# Patient Record
Sex: Female | Born: 2000 | Race: White | Hispanic: No | Marital: Single | Smoking: Never smoker
Health system: Southern US, Community
[De-identification: ages and names within clinical notes are randomized; demographics above are authoritative.]

---

## 2021-03-09 ENCOUNTER — Ambulatory Visit (HOSPITAL_COMMUNITY): Payer: Managed Care, Other (non HMO)

## 2021-03-09 ENCOUNTER — Other Ambulatory Visit: Payer: Self-pay

## 2021-03-09 ENCOUNTER — Encounter (HOSPITAL_COMMUNITY): Payer: Self-pay | Admitting: *Deleted

## 2021-03-09 ENCOUNTER — Ambulatory Visit (INDEPENDENT_AMBULATORY_CARE_PROVIDER_SITE_OTHER): Payer: Managed Care, Other (non HMO)

## 2021-03-09 ENCOUNTER — Ambulatory Visit (HOSPITAL_COMMUNITY)
Admission: EM | Admit: 2021-03-09 | Discharge: 2021-03-09 | Disposition: A | Discharge status: Home / Self Care | Payer: Managed Care, Other (non HMO) | Attending: Student | Admitting: Student

## 2021-03-09 DIAGNOSIS — S60221A Contusion of right hand, initial encounter: Secondary | ICD-10-CM | POA: Diagnosis not present

## 2021-03-09 DIAGNOSIS — S6721XA Crushing injury of right hand, initial encounter: Secondary | ICD-10-CM | POA: Diagnosis not present

## 2021-03-09 NOTE — ED Triage Notes (Signed)
Pt closed rt middle and ring finger in bed room window.

## 2021-03-09 NOTE — ED Provider Notes (Signed)
[facility]    CSN: [id number] Arrival date & time: 03/09/21  1756      History   Chief Complaint Chief Complaint  Patient presents with   Finger Injury    HPI Tanya Mack is a 20 y.o. female presenting with contusion of right middle 3 fingers following shutting her right hand in bedroom window.  States incident occurred few hours ago.  Pain at the base of her fingers and the PIP joints, though she can bend the fingers without issue.  Denies sensation changes.  She is right-handed.  Denies pain or injury elsewhere.  HPI  History reviewed. No pertinent past medical history.  There are no problems to display for this patient.   History reviewed. No pertinent surgical history.  OB History   No obstetric history on file.      Home Medications    Prior to Admission medications   Not on File    Family History History reviewed. No pertinent family history.  Social History Social History   Tobacco Use   Smoking status: Never   Smokeless tobacco: Never     Allergies   Patient has no known allergies.   Review of Systems Review of Systems  Musculoskeletal:        R hand contusion  All other systems reviewed and are negative.   Physical Exam Triage Vital Signs ED Triage Vitals  Enc Vitals Group     BP 03/09/21 1814 122/66     Pulse Rate 03/09/21 1814 70     Resp 03/09/21 1814 18     Temp 03/09/21 1814 98.6 F (37 C)     Temp src --      SpO2 03/09/21 1814 98 %     Weight --      Height --      Head Circumference --      Peak Flow --      Pain Score 03/09/21 1816 6     Pain Loc --      Pain Edu? --      Excl. in GC? --    No data found.  Updated Vital Signs BP 122/66   Pulse 70   Temp 98.6 F (37 C)   Resp 18   LMP 02/23/2021 (Approximate)   SpO2 98%   Visual Acuity Right Eye Distance:   Left Eye Distance:   Bilateral Distance:    Right Eye Near:   Left Eye Near:    Bilateral Near:     Physical Exam Vitals  reviewed.  Constitutional:      General: She is not in acute distress.    Appearance: Normal appearance. She is not ill-appearing or diaphoretic.  HENT:     Head: Normocephalic and atraumatic.  Cardiovascular:     Rate and Rhythm: Normal rate and regular rhythm.     Heart sounds: Normal heart sounds.  Pulmonary:     Effort: Pulmonary effort is normal.     Breath sounds: Normal breath sounds.  Musculoskeletal:     Comments: R fingers 2-4 with tenderness over dorsal aspect of 1st phalanx and PIP joint without effusion ecchymosis or obvious bony deformity. ROM PIP and DIP intact and with minimal pain. Grip strength 5/5, cap refill <2 seconds, radial pulse 2+. No snuffbox tenderness.  Skin:    General: Skin is warm.  Neurological:     General: No focal deficit present.     Mental Status: She is alert and oriented to person, place,  and time.  Psychiatric:        Mood and Affect: Mood normal.        Behavior: Behavior normal.        Thought Content: Thought content normal.        Judgment: Judgment normal.     UC Treatments / Results  Labs (all labs ordered are listed, but only abnormal results are displayed) Labs Reviewed - No data to display  EKG   Radiology DG Hand Complete Right  Result Date: 03/09/2021 CLINICAL DATA:  Shut middle 3 fingers in car door EXAM: RIGHT HAND - COMPLETE 3+ VIEW COMPARISON:  None. FINDINGS: No acute fracture or dislocation. Joint spaces and alignment are maintained. No area of erosion or osseous destruction. No unexpected radiopaque foreign body. Soft tissues are unremarkable. IMPRESSION: No acute fracture or dislocation. Electronically Signed   By: Meda Klinefelter M.D.   On: 03/09/2021 18:18    Procedures Procedures (including critical care time)  Medications Ordered in UC Medications - No data to display  Initial Impression / Assessment and Plan / UC Course  I have reviewed the triage vital signs and the nursing notes.  Pertinent labs &  imaging results that were available during my care of the patient were reviewed by me and considered in my medical decision making (see chart for details).     This patient is a very pleasant 20 y.o. year old female presenting with finger contusion R hand following shutting hand in bedroom window. Xray negative for bony abnormality. Reassurance provided. Patient declines splint which I am in agreement with. ED return precautions discussed. Patient verbalizes understanding and agreement.     Final Clinical Impressions(s) / UC Diagnoses   Final diagnoses:  Contusion of right hand, initial encounter     Discharge Instructions      -Your xrays look good! -Rest, ice, tylenol/ibuprofen -Follow-up if symptoms worsen/persist   ED Prescriptions   None    PDMP not reviewed this encounter.   Rhys Martini, PA-C 03/09/21 [phone]

## 2021-03-09 NOTE — Discharge Instructions (Addendum)
-  Your xrays look good! -Rest, ice, tylenol/ibuprofen -Follow-up if symptoms worsen/persist

## 2022-09-16 IMAGING — DX DG HAND COMPLETE 3+V*R*
3 series · 3 of 3 positions shown · non-contrast
Comparison: None.

CLINICAL DATA: Shut middle 3 fingers in car door

EXAM:
RIGHT HAND - COMPLETE 3+ VIEW

[hand pa]
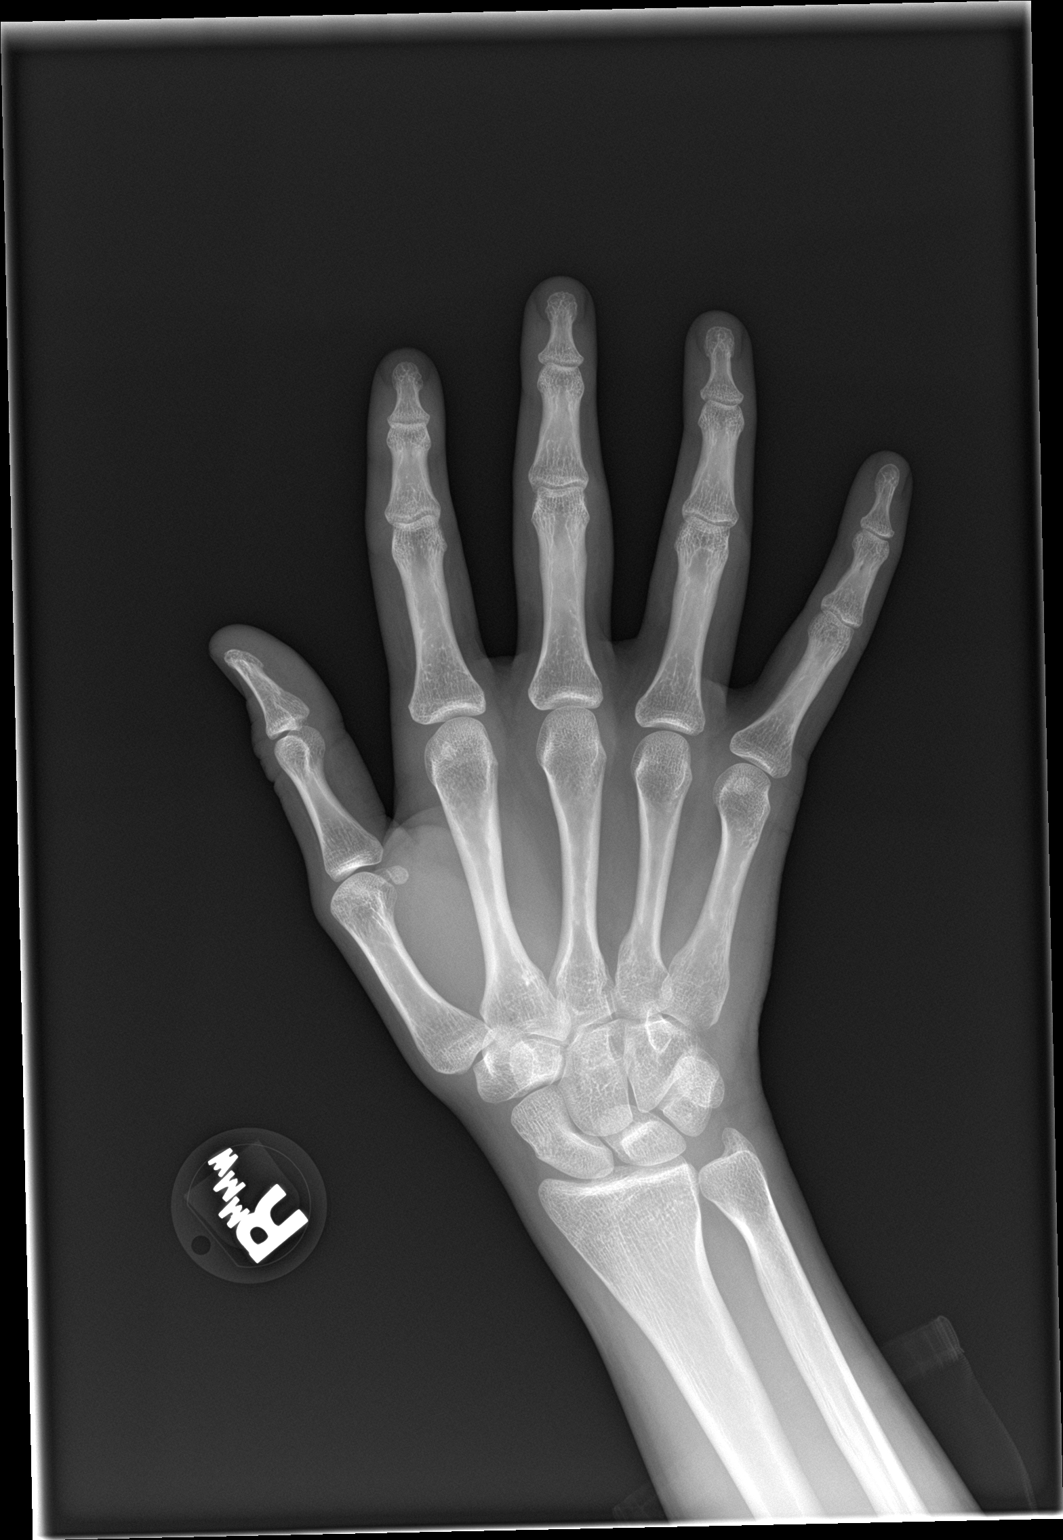

[hand obl]
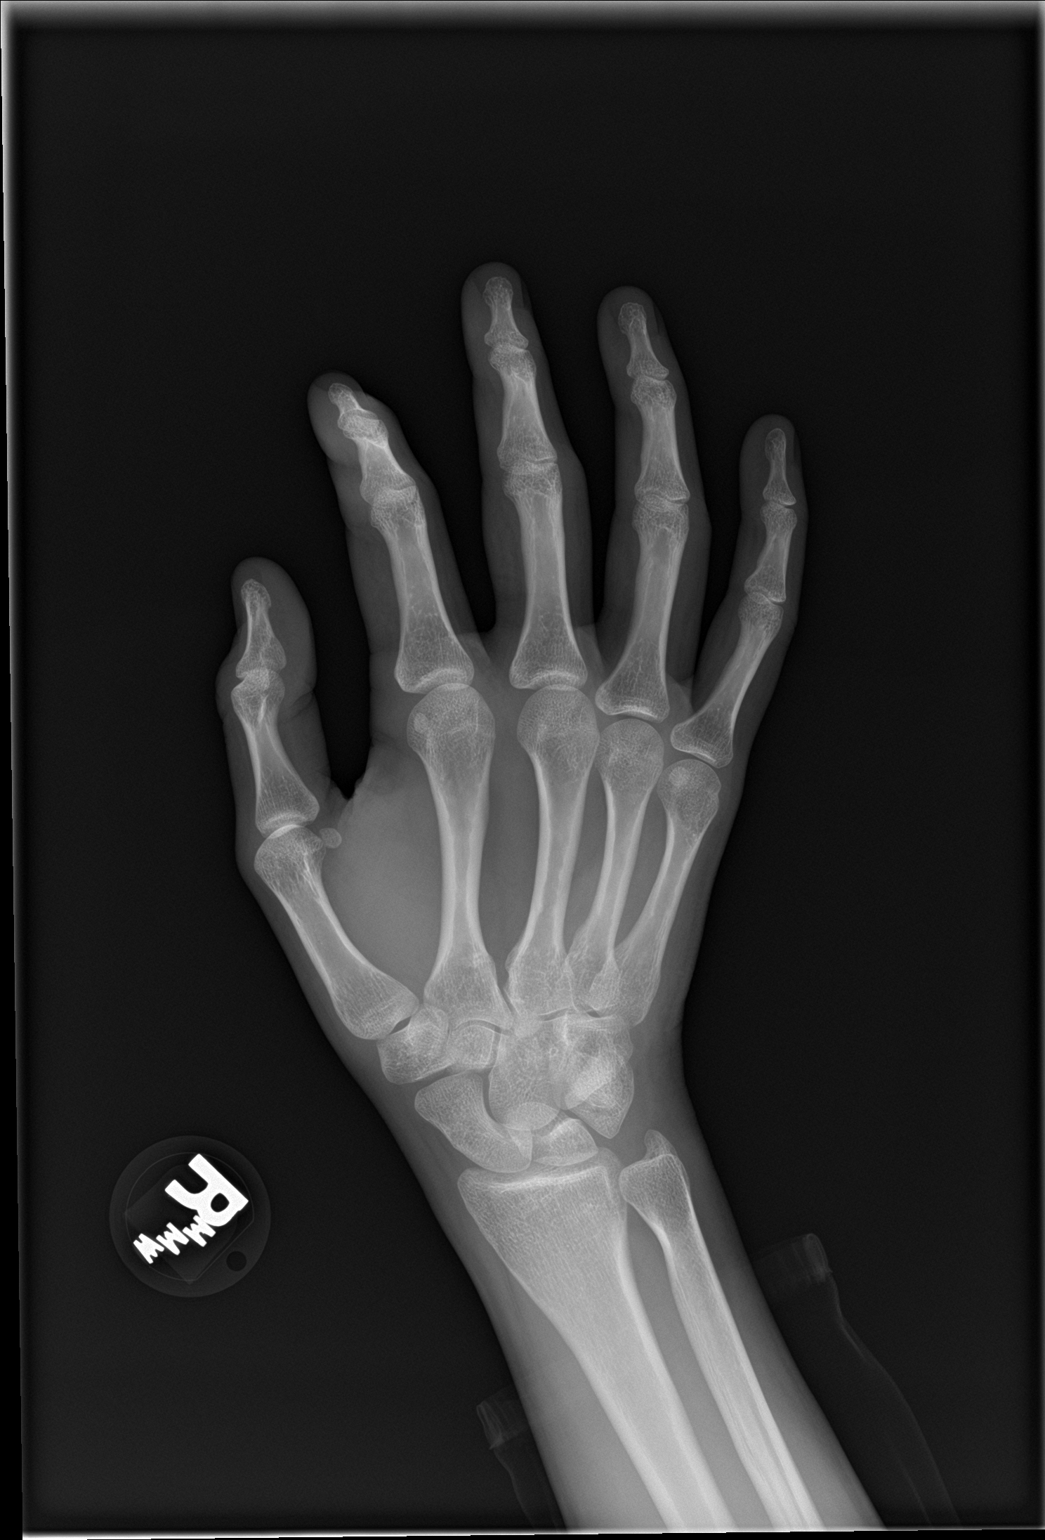

[hand lat]
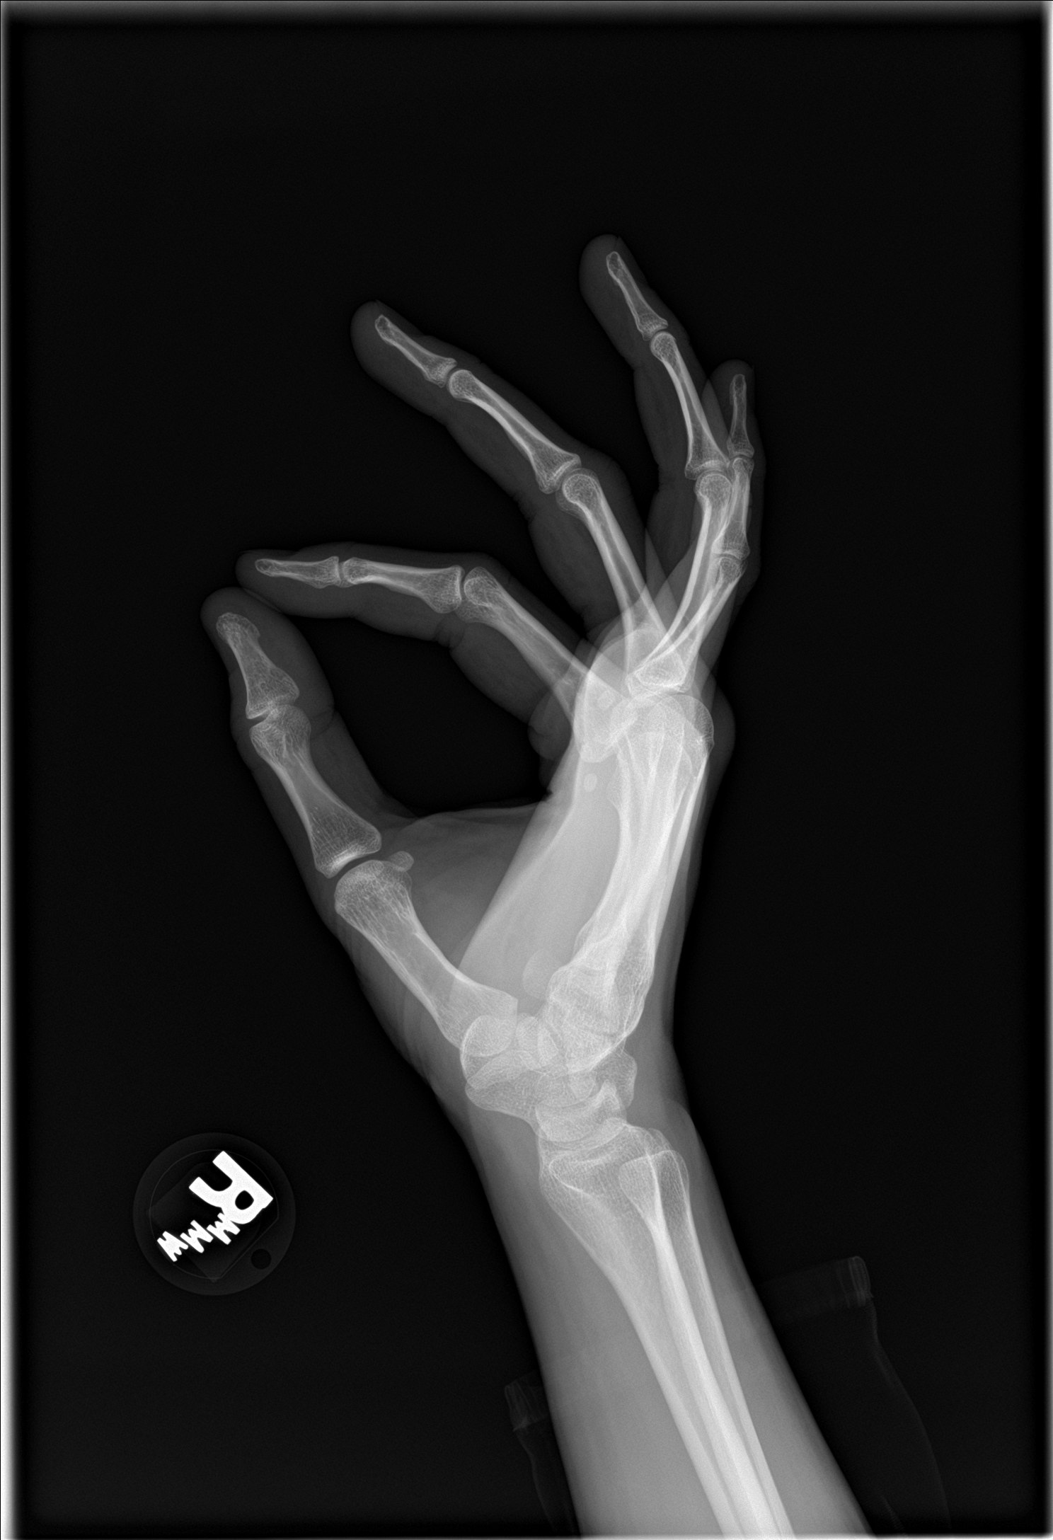

[3 of 3 positions shown; findings below may reference images not displayed]

FINDINGS: No acute fracture or dislocation. Joint spaces and alignment are
maintained. No area of erosion or osseous destruction. No unexpected
radiopaque foreign body. Soft tissues are unremarkable.
IMPRESSION: No acute fracture or dislocation.
